# Patient Record
Sex: Male | Born: 1960 | Race: White | Hispanic: No | Marital: Single
Health system: Southern US, Community
[De-identification: ages and names within clinical notes are randomized; demographics above are authoritative.]

---

## 2005-04-04 ENCOUNTER — Ambulatory Visit: Payer: Self-pay | Admitting: Internal Medicine

## 2005-04-04 ENCOUNTER — Other Ambulatory Visit: Payer: Self-pay

## 2005-04-04 ENCOUNTER — Inpatient Hospital Stay: Payer: Self-pay

## 2012-03-14 ENCOUNTER — Inpatient Hospital Stay: Payer: Self-pay | Admitting: Family Medicine

## 2012-03-14 LAB — COMPREHENSIVE METABOLIC PANEL
Alkaline Phosphatase: 870 U/L — ABNORMAL HIGH (ref 50–136)
Calcium, Total: 9.4 mg/dL (ref 8.5–10.1)
Co2: 27 mmol/L (ref 21–32)
EGFR (African American): >60
EGFR (Non-African Amer.): >60
Osmolality: 276 (ref 275–301)
SGOT(AST): 372 U/L — ABNORMAL HIGH (ref 15–37)
SGPT (ALT): 438 U/L — ABNORMAL HIGH (ref 12–78)
Sodium: 137 mmol/L (ref 136–145)

## 2012-03-14 LAB — ETHANOL
Ethanol %: <0.003 % (ref 0.000–0.080)
Ethanol: <3 mg/dL

## 2012-03-14 LAB — URINALYSIS, COMPLETE
Bacteria: NONE SEEN
Blood: NEGATIVE
Glucose,UR: NEGATIVE mg/dL (ref 0–75)
Leukocyte Esterase: NEGATIVE
Nitrite: NEGATIVE
WBC UR: 1 /HPF (ref 0–5)

## 2012-03-14 LAB — MAGNESIUM: Magnesium: 2.2 mg/dL

## 2012-03-14 LAB — PROTIME-INR: Prothrombin Time: 12 secs (ref 11.5–14.7)

## 2012-03-14 LAB — CBC
HCT: 45.2 % (ref 40.0–52.0)
MCH: 33.1 pg (ref 26.0–34.0)
MCHC: 35 g/dL (ref 32.0–36.0)
MCV: 95 fL (ref 80–100)
Platelet: 249 10*3/uL (ref 150–440)
RDW: 14.1 % (ref 11.5–14.5)

## 2012-03-14 LAB — LIPASE, BLOOD: Lipase: 1102 U/L — ABNORMAL HIGH (ref 73–393)

## 2012-03-15 LAB — COMPREHENSIVE METABOLIC PANEL
Alkaline Phosphatase: 736 U/L — ABNORMAL HIGH (ref 50–136)
Bilirubin,Total: 9.7 mg/dL — ABNORMAL HIGH (ref 0.2–1.0)
Calcium, Total: 8.5 mg/dL (ref 8.5–10.1)
Chloride: 107 mmol/L (ref 98–107)
Co2: 24 mmol/L (ref 21–32)
Creatinine: 0.56 mg/dL — ABNORMAL LOW (ref 0.60–1.30)
EGFR (African American): >60
EGFR (Non-African Amer.): >60
SGOT(AST): 290 U/L — ABNORMAL HIGH (ref 15–37)
SGPT (ALT): 366 U/L — ABNORMAL HIGH (ref 12–78)
Sodium: 140 mmol/L (ref 136–145)

## 2012-03-15 LAB — CBC WITH DIFFERENTIAL/PLATELET
Basophil: 2 %
Eosinophil: 9 %
HCT: 41.3 % (ref 40.0–52.0)
HGB: 14.1 g/dL (ref 13.0–18.0)
Lymphocytes: 26 %
Monocytes: 14 %
RBC: 4.37 10*6/uL — ABNORMAL LOW (ref 4.40–5.90)
Segmented Neutrophils: 48 %
Variant Lymphocyte - H1-Rlymph: 1 %
WBC: 7.1 10*3/uL (ref 3.8–10.6)

## 2012-03-15 LAB — IRON AND TIBC
Iron Bind.Cap.(Total): 289 ug/dL (ref 250–450)
Iron: 78 ug/dL (ref 65–175)

## 2012-03-16 LAB — COMPREHENSIVE METABOLIC PANEL
Albumin: 2.6 g/dL — ABNORMAL LOW (ref 3.4–5.0)
Alkaline Phosphatase: 706 U/L — ABNORMAL HIGH (ref 50–136)
BUN: 7 mg/dL (ref 7–18)
Bilirubin,Total: 8.3 mg/dL — ABNORMAL HIGH (ref 0.2–1.0)
Chloride: 108 mmol/L — ABNORMAL HIGH (ref 98–107)
Creatinine: 0.62 mg/dL (ref 0.60–1.30)
EGFR (Non-African Amer.): >60
Osmolality: 281 (ref 275–301)
Potassium: 3.8 mmol/L (ref 3.5–5.1)
Sodium: 142 mmol/L (ref 136–145)

## 2012-03-16 LAB — HEPATIC FUNCTION PANEL A (ARMC): Bilirubin, Direct: 6 mg/dL — ABNORMAL HIGH (ref 0.00–0.20)

## 2012-03-16 LAB — TSH: Thyroid Stimulating Horm: 1.79 u[IU]/mL

## 2012-03-17 LAB — CBC WITH DIFFERENTIAL/PLATELET
Basophil: 1 %
Eosinophil: 3 %
HCT: 41.1 % (ref 40.0–52.0)
HGB: 13.4 g/dL (ref 13.0–18.0)
Lymphocytes: 22 %
MCHC: 32.7 g/dL (ref 32.0–36.0)
MCV: 95 fL (ref 80–100)
RDW: 13.4 % (ref 11.5–14.5)
WBC: 8 10*3/uL (ref 3.8–10.6)

## 2012-03-17 LAB — COMPREHENSIVE METABOLIC PANEL
Albumin: 2.7 g/dL — ABNORMAL LOW (ref 3.4–5.0)
Alkaline Phosphatase: 728 U/L — ABNORMAL HIGH (ref 50–136)
Bilirubin,Total: 5.8 mg/dL — ABNORMAL HIGH (ref 0.2–1.0)
Calcium, Total: 8.4 mg/dL — ABNORMAL LOW (ref 8.5–10.1)
Chloride: 108 mmol/L — ABNORMAL HIGH (ref 98–107)
Co2: 24 mmol/L (ref 21–32)
Creatinine: 0.71 mg/dL (ref 0.60–1.30)
EGFR (Non-African Amer.): >60
Glucose: 114 mg/dL — ABNORMAL HIGH (ref 65–99)
SGPT (ALT): 286 U/L — ABNORMAL HIGH (ref 12–78)
Total Protein: 6.2 g/dL — ABNORMAL LOW (ref 6.4–8.2)

## 2014-01-20 IMAGING — US ABDOMEN ULTRASOUND LIMITED
1 series · 14 of 25 positions shown · non-contrast
Comparison: none

REASON FOR EXAM: abnormal liver tests
COMMENTS:   Body Site: GB and Fossa, CBD, Head of Pancreas; Pancreas

PROCEDURE:     US  - US ABDOMEN LIMITED SURVEY  - March 14, 2012  [DATE]
RESULT:     Comparison: None
TECHNIQUE: Multiple gray-scale and color-flow Doppler images of the right
upper quadrant are presented for review.

[Series 1: abdomen ultrasound limited · 0.31mm/px · 14 of 43 slices shown]
[im 1/43]
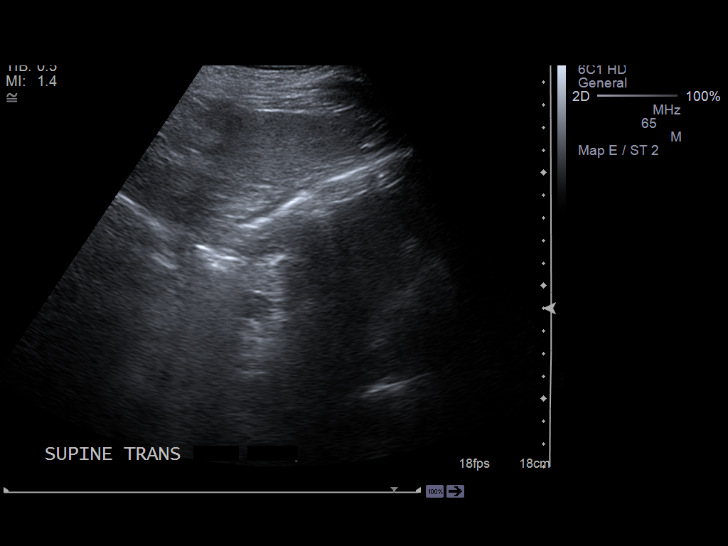
[im 4/43]
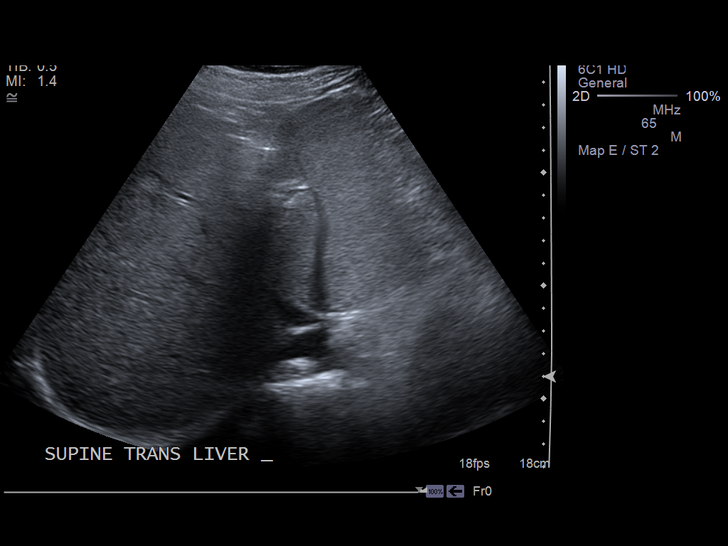
[im 8/43]
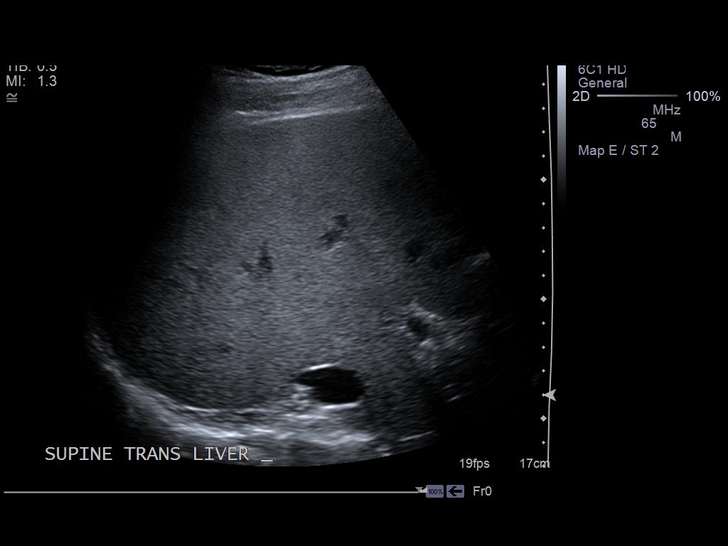
[im 11/43]
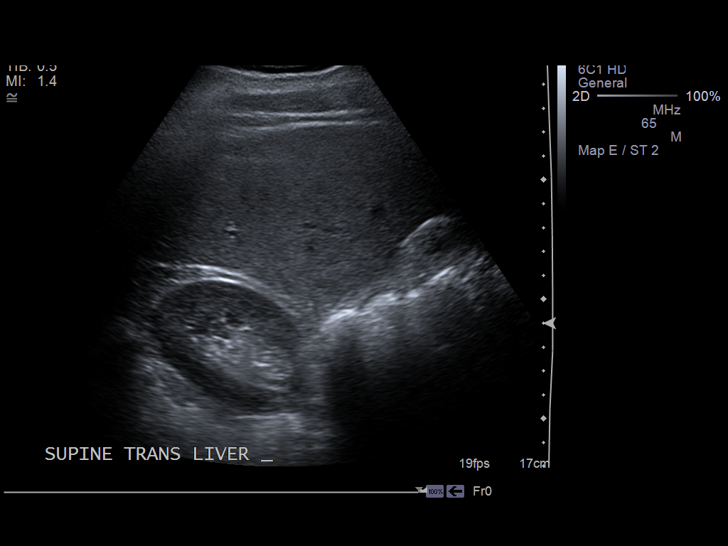
[im 15/43]
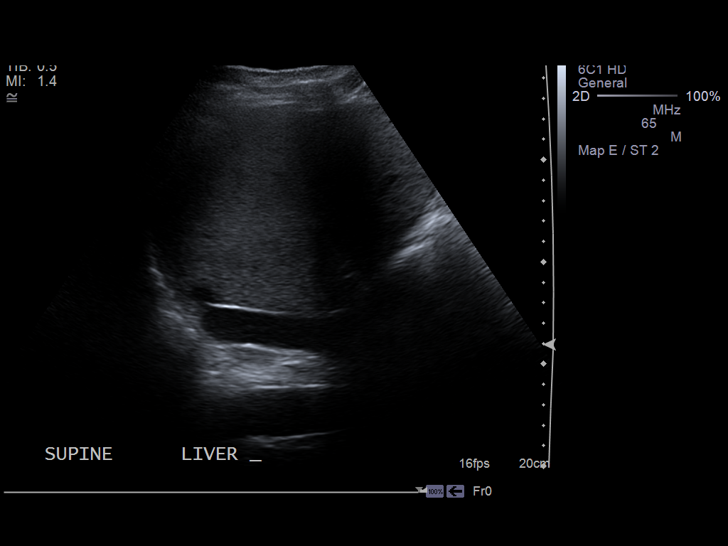
[im 16/43]
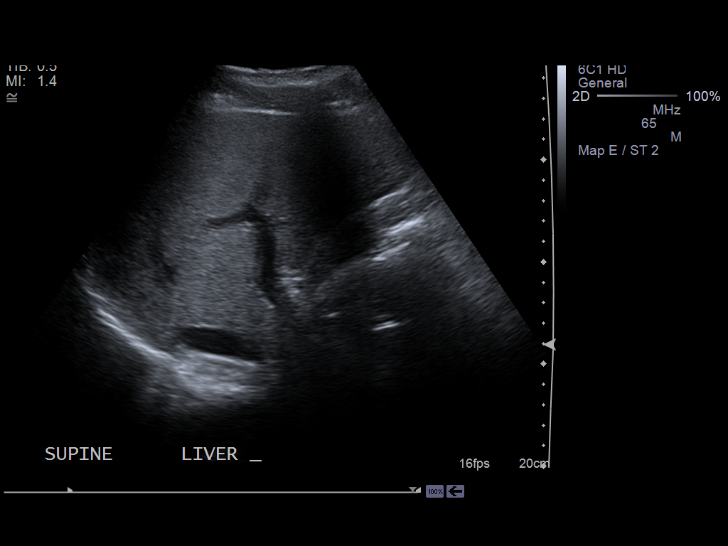
[im 20/43]
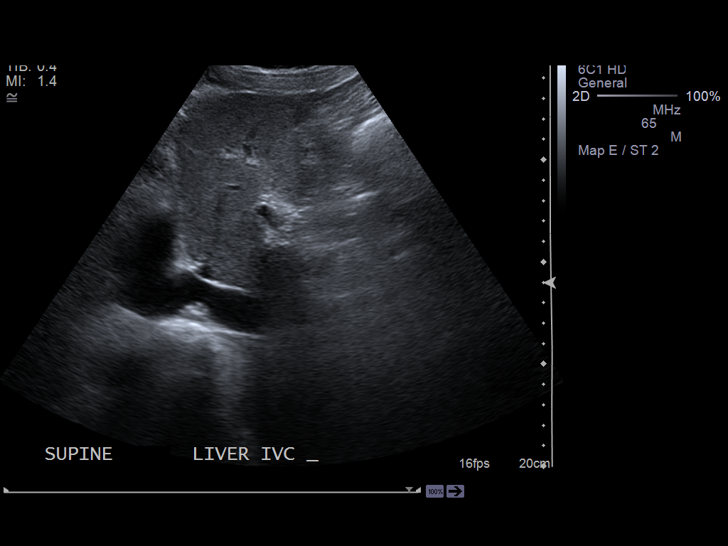
[im 23/43]
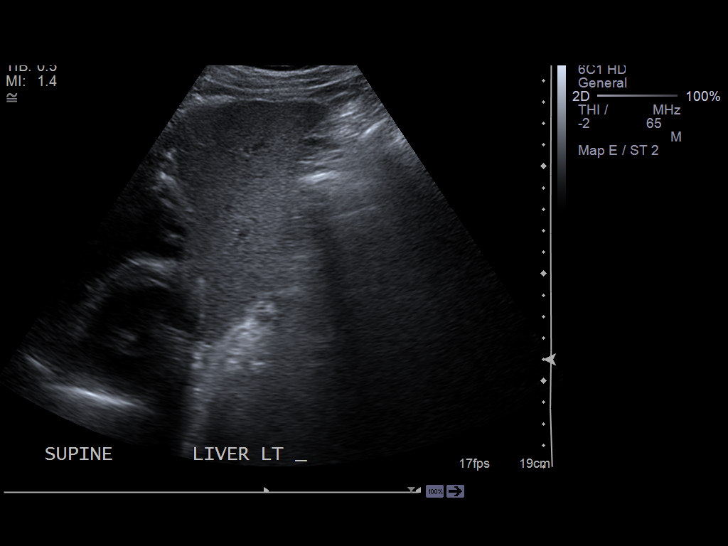
[im 27/43]
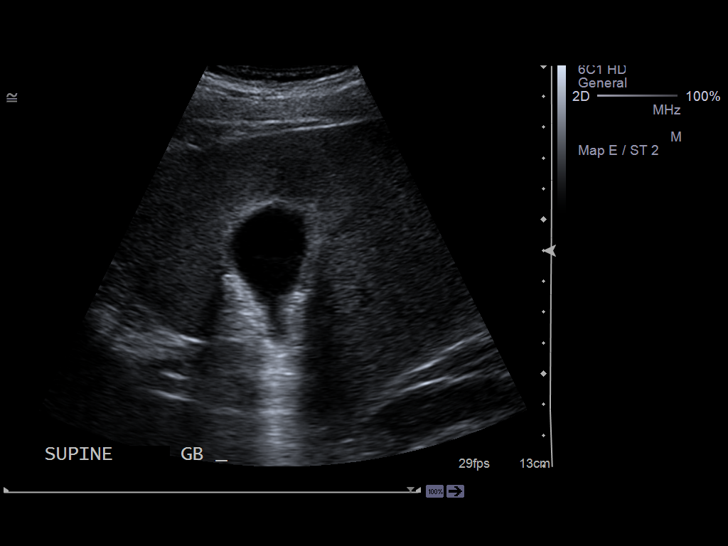
[im 29/43]
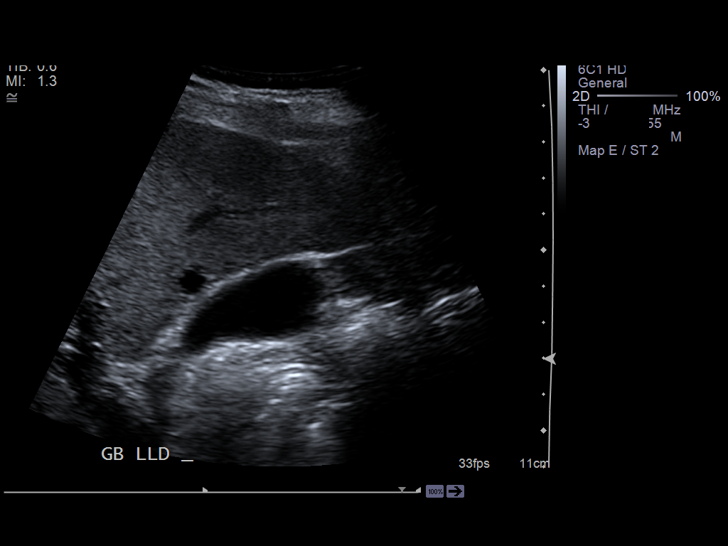
[im 32/43]
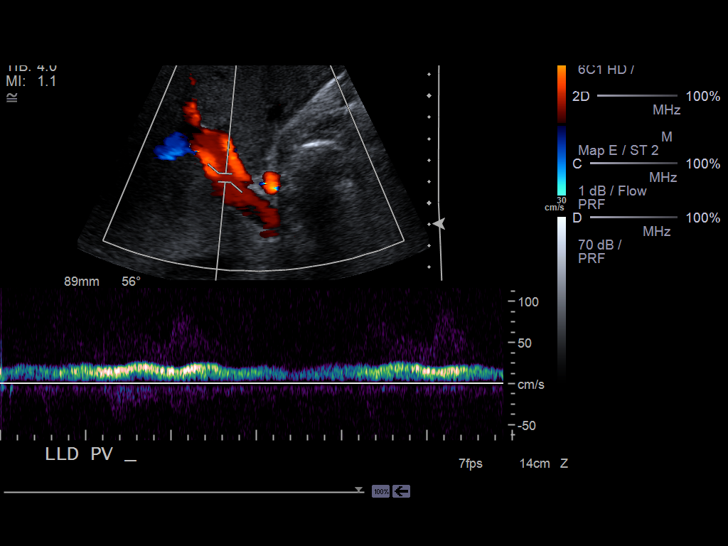
[im 36/43]
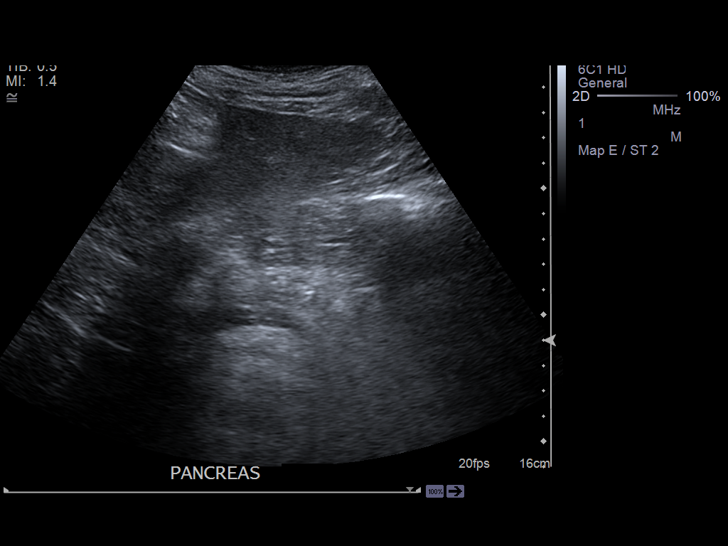
[im 39/43]
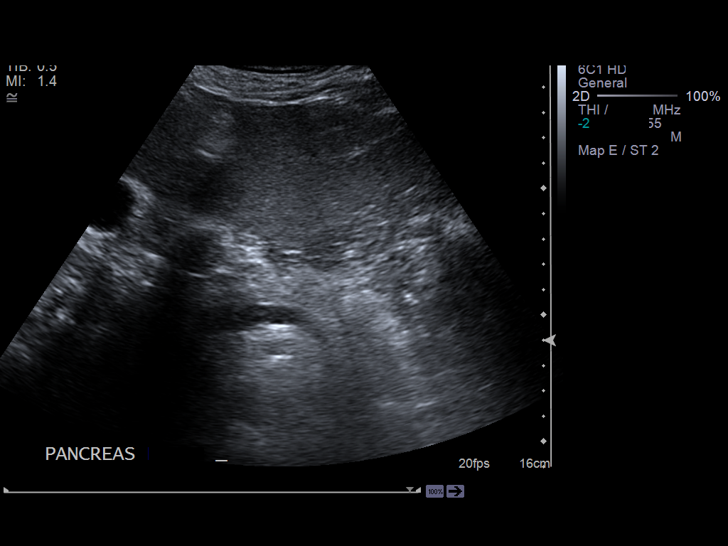
[im 43/43]
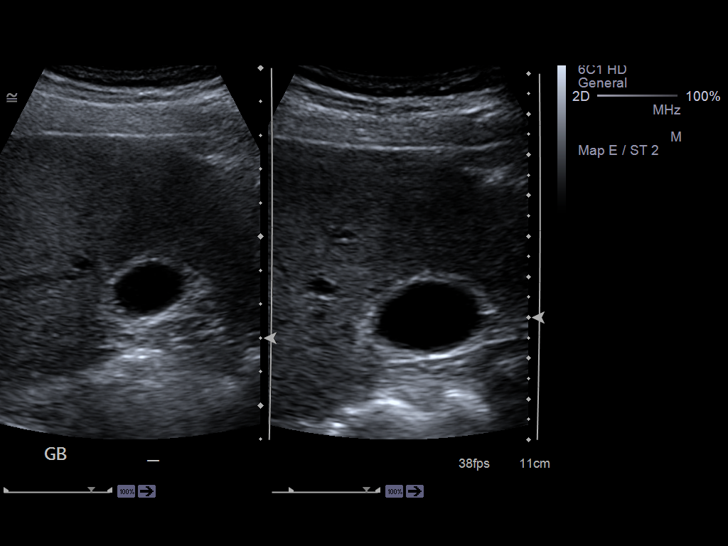

[14 of 25 positions shown; findings below may reference images not displayed]

FINDINGS: The liver is diffusely increased in echogenicity as can be seen with hepatic
steatosis. The liver is mildly increased in size measuring 18.6 cm in
length. The liver is without evidence of a focal hepatic lesion.

There is no cholelithiasis or biliary sludge. There is no intra- or
extrahepatic biliary ductal dilatation. The common duct measures 4.2 mm in
maximal diameter. There is no gallbladder wall thickening, pericholecystic
fluid, or sonographic Murphy's sign.

The pancreas is nonvisualized secondary to overlying bowel gas.
IMPRESSION: No cholelithiasis or sonographic evidence of acute cholecystitis.

Hepatic steatosis. Mild hepatomegaly.

[REDACTED]

## 2014-01-20 IMAGING — CT CT ABD-PELV W/ CM
1 of 2 series · 14 of 32 positions shown, 18 images · non-contrast
Comparison: none

REASON FOR EXAM: (1) jaudice; (2) jaundice
COMMENTS:

[Series 2: 3mm soft tissue · axial · 0.75mm/px · z∈[-520,-112]mm · 14 of 150 slices shown, 18 images]
[im 7/150  soft-tissue]
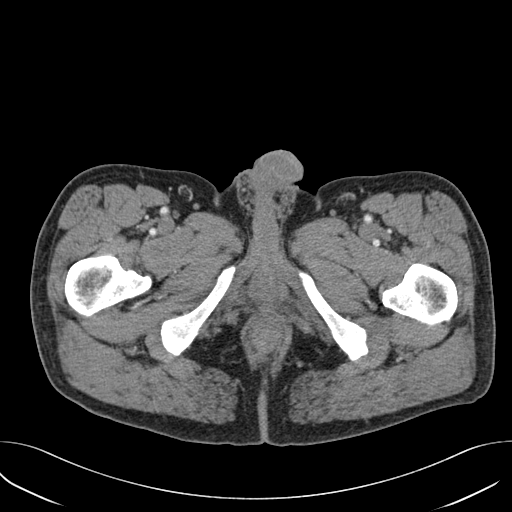
[im 7/150  bone]
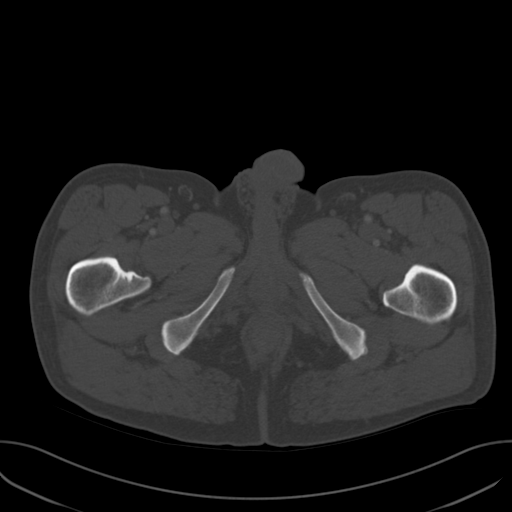
[im 19/150  soft-tissue]
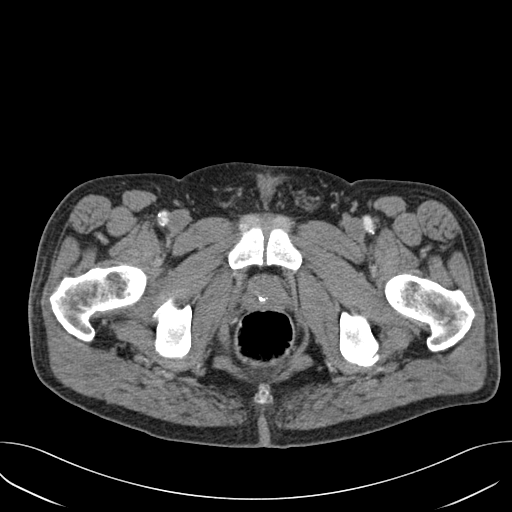
[im 32/150  soft-tissue]
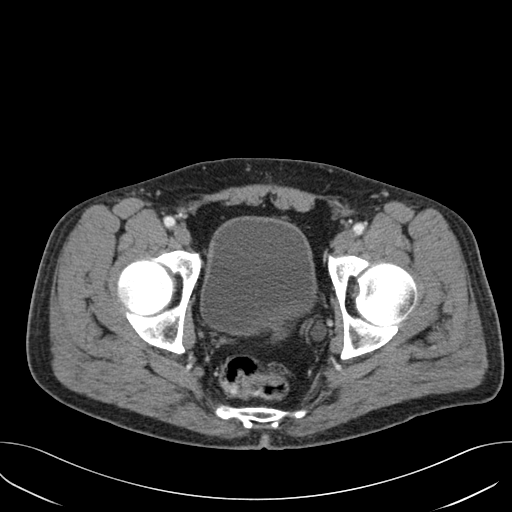
[im 44/150  soft-tissue]
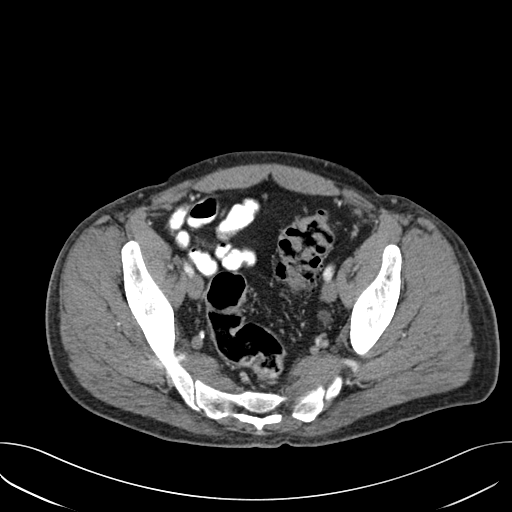
[im 56/150  soft-tissue]
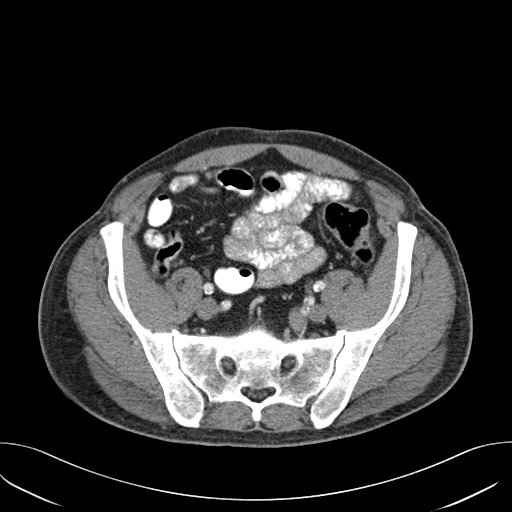
[im 69/150  soft-tissue]
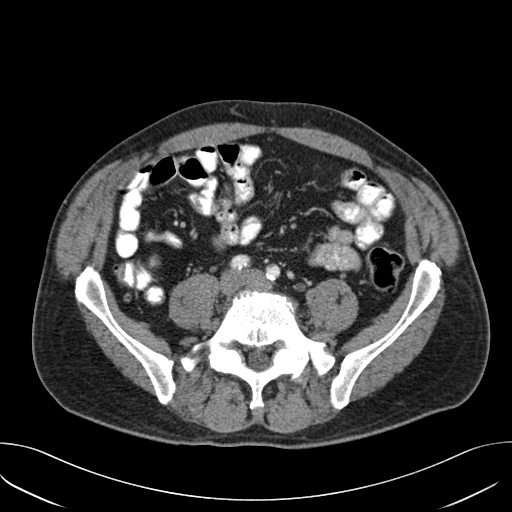
[im 81/150  soft-tissue]
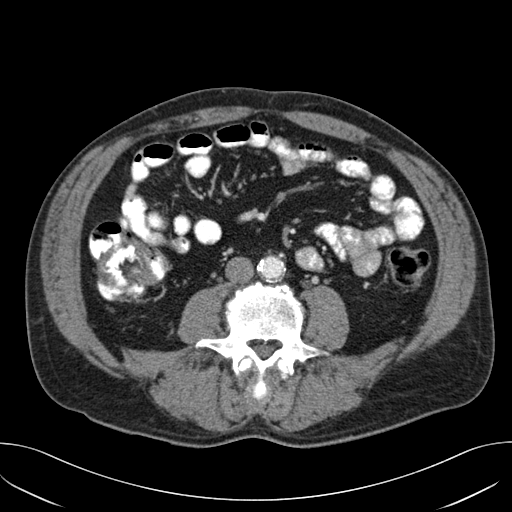
[im 94/150  soft-tissue]
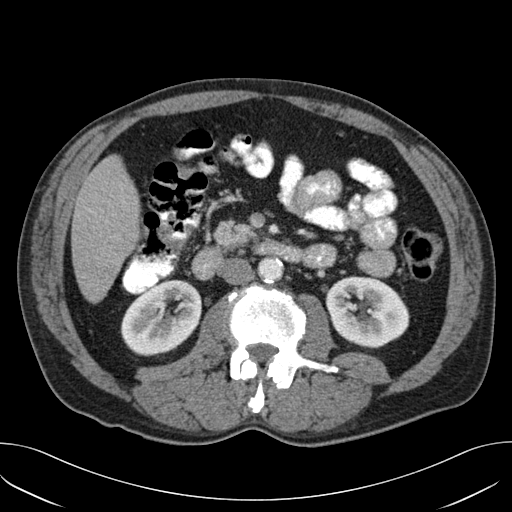
[im 106/150  soft-tissue]
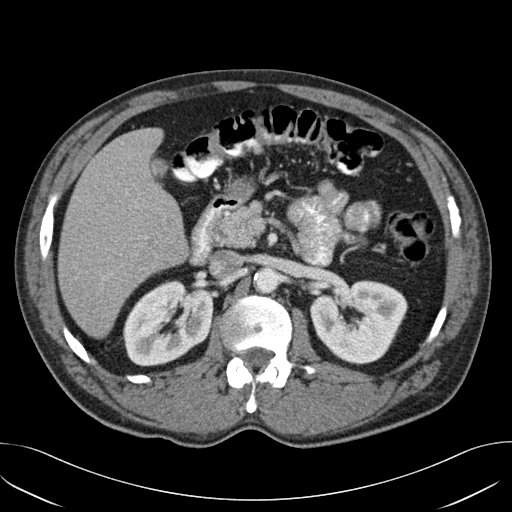
[im 106/150  bone]
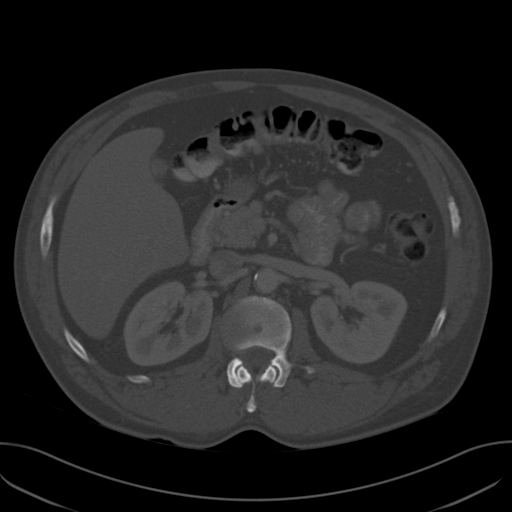
[im 118/150  soft-tissue]
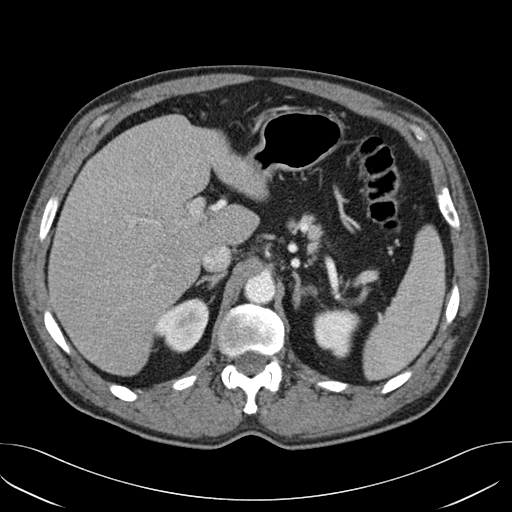
[im 125/150  lung]
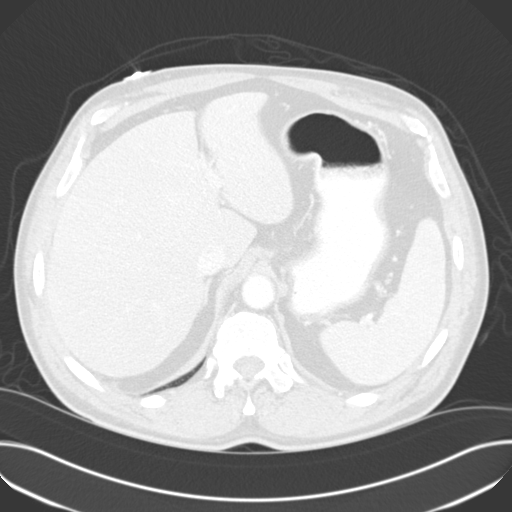
[im 131/150  soft-tissue]
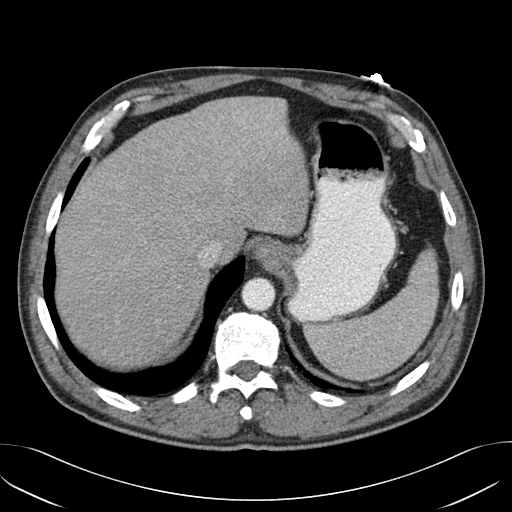
[im 131/150  lung]
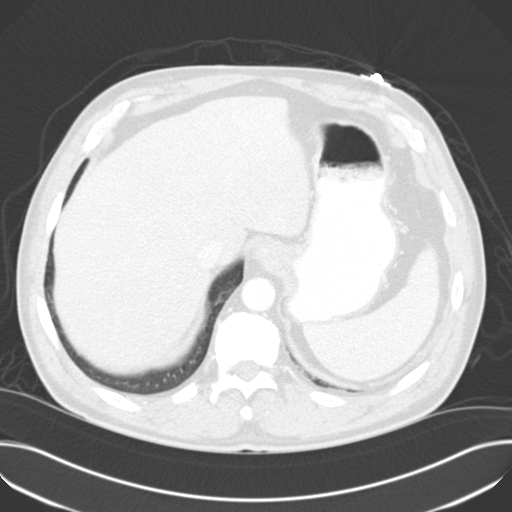
[im 137/150  lung]
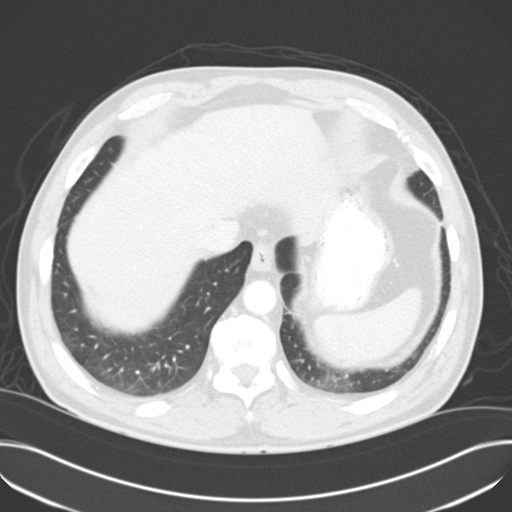
[im 143/150  soft-tissue]
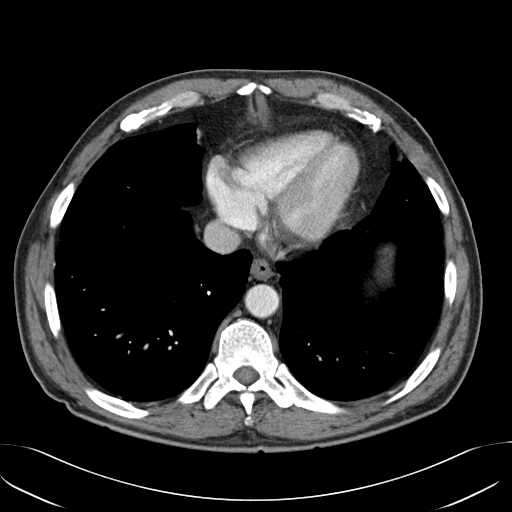
[im 143/150  lung]
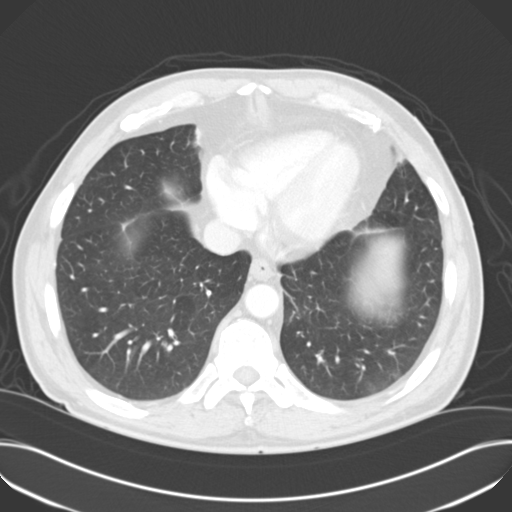

[14 of 32 positions shown; findings below may reference images not displayed]

PROCEDURE:     CT  - CT ABDOMEN / PELVIS  W  - March 15, 2012 [DATE]

RESULT:     Axial CT scanning was performed through the abdomen and pelvis
with reconstructions at 3 mm intervals and slice thicknesses following
intravenous administration of 100 cc of [id number]. Review of multiplanar
reconstructed images was performed separately on the VIA monitor.

The liver exhibits no focal mass nor ductal dilation. The gallbladder is
contracted. No stones are evident. The stomach is moderately distended with
orally administered contrast but no fluid is evident. The pancreas, spleen,
and adrenal glands are normal in appearance.

The kidneys enhance symmetrically. There are no calcified stones nor
evidence of obstruction of the intrarenal collecting systems. There is mild
dilation of the left ureter especially in its mid and distal thirds. There
may be a ureterocele at the level of the left ureterovesical junction. The
prostate gland produces a mild impression upon the urinary bladder base. No
bladder stone or urethral stone is visible.

The caliber of the abdominal aorta exhibits mild failure to taper in the
infrarenal region with the maximal measured diameter of 2.2 cm. The
partially contrast-filled loops of small bowel are normal in appearance. No
contrast has yet reached the colon. There is no evidence of colitis or
diverticulitis. A normal calibered partially air and contrast-filled
appendix is demonstrated.

The lung bases are clear. The lumbar vertebral bodies are preserved in
height. There is degenerative disc change of the mid and lower lumbar spine.
IMPRESSION: 1. There is mild dilation of the mid and distal portions of the left ureter
there may be a ureterocele. There is no evidence of hydronephrosis. There
are no findings to suggest pyelonephritis. No calcified stones are evident.
2. The gallbladder is contracted. This may be due to recent ingestion of a
meal but no food particles are demonstrated in the stomach. Dose calcified
gallstones are evident. Further evaluation with gallbladder ultrasound and a
hepatobiliary scan may be useful if gallbladder dysfunction is suspected.
3. There is no acute abnormality of the liver or pancreas or spleen.
4. No acute bowel abnormality is demonstrated.
5. There are mild atherosclerotic changes of the abdominal aorta.

A preliminary report was sent to the [HOSPITAL] the conclusion
of the study.

[REDACTED]

## 2014-08-26 NOTE — Consult Note (Signed)
CC: alcoholic liver disease.  Pt feeling better, ate all of tray and wants regular food.  LFT's falling some with TB down to 8.3, alk phos to 700, lipase normal.  No signs or symptoms of alcohol withdrawal.  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  Chest clear, heart RRR.  Will advance diet to low fat.  Dr, Dionne Milo will cover this weekend and will see the patient.  Electronic Signatures: Manya Silvas (MD)  (Signed on 08-Nov-13 12:59)  Authored  Last Updated: 08-Nov-13 12:59 by Manya Silvas (MD)

## 2014-08-26 NOTE — Discharge Summary (Signed)
PATIENT NAME:  Andrew Powers, Andrew Powers MR#:  [id number] DATE OF BIRTH:  1960/06/04  DATE OF ADMISSION:  03/14/2012 DATE OF DISCHARGE:  03/17/2012  ADDENDUM  TIME SPENT WITH THIS PATIENT ON DISCHARGE: 38 minutes education given about alcohol cessation and resources, alcoholic's anonymous. Case discussed with Child psychotherapistsocial worker, who will help also reinforcing this.   ____________________________ Felipa Furnaceoberto Sanchez Gutierrez, MD rsg:cms D: 03/17/2012 14:51:22 ET T: 03/19/2012 10:49:28 ET JOB#: [id number]  cc: Felipa Furnaceoberto Sanchez Gutierrez, MD, <Dictator> Jamesa Tedrick Juanda ChanceSANCHEZ GUTIERRE MD ELECTRONICALLY SIGNED 03/21/2012 12:34

## 2014-08-26 NOTE — H&P (Signed)
PATIENT NAME:  Andrew Powers, Verl MR#:  [id number] DATE OF BIRTH:  March 26, 1961  DATE OF ADMISSION:  03/14/2012  PRIMARY CARE PHYSICIAN: Toy CookeyErnest Eason, MD  ED REFERRING PHYSICIAN: Malachy Moanevainder Goli, MD  CHIEF COMPLAINT: Jaundice.   HISTORY OF PRESENT ILLNESS: The patient is a 54 year old white male with daily alcohol use who reported that he started noticing his eyes started becoming yellow in color. He also, when he was urinating, noticed that he had dark-colored urine. He did not have any abdominal pain, nausea, vomiting, or itching. He does report some blurred vision. He otherwise denies any fevers or chills, no chest pain, no shortness of breath, no changes in his bowel habits, no hematemesis, and no hematochezia.   PAST MEDICAL HISTORY: The patient denies any history, however, in reviewing previously noted history and physical, the patient had a cerebral acute infarct, as well as has history of hyperlipidemia. The patient denies any of this history.   PAST SURGICAL HISTORY: None.   ALLERGIES: No known drug allergies.   MEDICATIONS: None.   SOCIAL HISTORY: He smokes about one pack per day. He drinks five to six beers per day. He denies any hard liquor. He denies any drug use.   FAMILY HISTORY: Father with coronary artery disease.   REVIEW OF SYSTEMS: CONSTITUTIONAL: Denies any fevers, fatigue, weakness, or pain. No weight loss or weight gain. EYES: Does have some blurred vision. Denies any pain, redness, inflammation, glaucoma, or cataracts. ENT: Denies any tinnitus, ear pain, hearing loss, or difficulty swallowing. RESPIRATORY: Denies any cough, wheezing, or hemoptysis. CARDIOVASCULAR: Denies any chest pain. No orthopnea. No edema. No arrhythmia. GASTROINTESTINAL: No nausea, vomiting, diarrhea, abdominal pain, hematemesis, or melena. No gastroesophageal reflux disease. No irritable bowel syndrome. No rectal bleeding. GENITOURINARY: Denies any dysuria, hematuria, renal colic, or frequency. ENDOCRINE:  Denies any polyuria, nocturia, or thyroid problems. HEME/LYMPH: Denies anemia, easy bruisability, or bleeding. SKIN: No acne. He reports that his skin is jaundiced now. He denies any change in mole, hair, or skin. Denies any pain in the neck, back, or shoulder. NEURO: No numbness. No cerebrovascular accident. No transient ischemic attack. No seizures. PSYCHIATRIC: No anxiety. No insomnia. No ADD. No OCD.   PHYSICAL EXAMINATION:   VITAL SIGNS: Temperature 98.9, pulse 95, respirations 18, blood pressure 150/95, and O2 95% on room air.   GENERAL: The patient is a well-developed Caucasian male currently not in any acute distress.   HEENT: Head atraumatic, normocephalic. Pupils are equally round and reactive to light and accommodation. There is no conjunctival pallor. He has scleral icterus. Nasal exam shows no drainage or ulceration. Oropharynx is clear without any exudate.   NECK: No thyromegaly. No carotid bruits.   CARDIOVASCULAR: Regular rate and rhythm. No murmurs, rubs, clicks, or gallops. PMI is not displaced.   PULMONARY: Clear to auscultation bilaterally without any rales, rhonchi, or wheezing.   ABDOMEN: Soft and nondistended. Positive bowel sounds x4. There is no guarding. No rebound.   EXTREMITIES: No clubbing, cyanosis, or edema.   SKIN: He is jaundiced.   NEUROLOGICAL: Awake, alert, and oriented x3. No focal deficits.   LYMPHATICS: No lymph nodes palpable.   PSYCHIATRIC: Not anxious or depressed.   LABS/RADIOLOGIC STUDIES: Glucose 138, BUN 13, creatinine 0.62, sodium 137, potassium 3.6, chloride 104, and CO2 27. Magnesium 2.2. Lipase is 1102. Total protein is 7.1, albumin 3.1, bilirubin total 10.8, alkaline phosphatase 870, AST 372, and ALT 438. WBC 7.9, hemoglobin 15.8 and platelet count 249. INR 0.9.  Ultrasound per the ED MD  was negative.   ASSESSMENT AND PLAN: The patient is a 54 year old with history of alcohol abuse who presents with jaundice and elevated LFTs.   1. Painless jaundice with elevated LFTs. At this time, it seems to be obstructive jaundice, however, it also could be related to his alcohol abuse. At this time, we will go ahead and get a CT scan of the abdomen and pelvis with IV and p.o. contrast. We will get GI consult. Also hepatitis panel is currently pending.  2. Elevated lipase. No abdominal pain. We will keep him n.p.o. for the time being and repeat lipase in the a.m.  3. History of cerebrovascular accident, per old records; however, the patient does not recall and is not on any treatment.  4. Alcohol abuse. We will place him on CIWA protocol.  5. Nicotine addiction. The patient was counseled regarding smoking cessation. Nicotine patch offered. He would like to use a nicotine patch.   TIME SPENT: 45 minutes. ____________________________ Lacie Scotts Allena Katz, MD shp:slb D: 03/14/2012 22:28:23 ET T: 03/15/2012 08:22:15 ET JOB#: [id number]  cc: Russel Morain H. Allena Katz, MD, <Dictator> Serita Sheller. Maryellen Pile, MD Charise Carwin MD ELECTRONICALLY SIGNED 03/22/2012 13:26

## 2014-08-26 NOTE — Consult Note (Signed)
Chief Complaint:   Subjective/Chief Complaint Feels well. No complaints.   VITAL SIGNS/ANCILLARY NOTES: **Vital Signs.:   09-Nov-13 05:20   Vital Signs Type Routine   Temperature Temperature (F) 98.2   Celsius 36.7   Temperature Source Oral   Pulse Pulse 75   Respirations Respirations 18   Systolic BP Systolic BP 071   Diastolic BP (mmHg) Diastolic BP (mmHg) 90   Mean BP 106   Pulse Ox % Pulse Ox % 97   Pulse Ox Activity Level  At rest   Oxygen Delivery Room Air/ 21 %   Brief Assessment:   Additional Physical Exam Jaundiced. No distress. Awake and alert.   Lab Results: Hepatic:  09-Nov-13 03:02    Bilirubin, Total  5.8   Alkaline Phosphatase  728   SGPT (ALT)  286   SGOT (AST)  190   Total Protein, Serum  6.2   Albumin, Serum  2.7  Routine Chem:  09-Nov-13 03:02    Glucose, Serum  114   BUN 9   Creatinine (comp) 0.71   Sodium, Serum 141   Potassium, Serum 3.7   Chloride, Serum  108   CO2, Serum 24   Calcium (Total), Serum  8.4   Osmolality (calc) 281   eGFR (African American) >60   eGFR (Non-African American) >60 (eGFR values <29m/min/1.73 m2 may be an indication of chronic kidney disease (CKD). Calculated eGFR is useful in patients with stable renal function. The eGFR calculation will not be reliable in acutely ill patients when serum creatinine is changing rapidly. It is not useful in  patients on dialysis. The eGFR calculation may not be applicable to patients at the low and high extremes of body sizes, pregnant women, and vegetarians.)   Anion Gap 9  Routine Hem:  09-Nov-13 03:02    WBC (CBC) 8.0   RBC (CBC)  4.33   Hemoglobin (CBC) 13.4   Hematocrit (CBC) 41.1   Platelet Count (CBC) 289 (Result(s) reported on 17 Mar 2012 at 04:37AM.)   MCV 95   MCH 30.9   MCHC 32.7   RDW 13.4   Bands 1   Segmented Neutrophils 63   Lymphocytes 22   Monocytes 10   Eosinophil 3   Basophil 1   Diff Comment 1 ANISOCYTOSIS   Diff Comment 2 POIKILOCYTOSIS   Diff  Comment 3 LARGE PLATELETS   Diff Comment 4 PLTS VARIED IN SIZE  Result(s) reported on 17 Mar 2012 at 04:37AM.   Assessment/Plan:  Assessment/Plan:   Assessment Alcoholic hepatitis with jaundice. Bilirubin continues to improve. No signs of hepatic failure or encephalopathy. No signs of alcohol withdrawl.    Plan May go home if OK with primary service. Continue Thiamine as OP. Follow up with Dr. EVira Agaras OP in 2 weeks.   Electronic Signatures: IJill Side(MD)  (Signed [id number])  Authored: Chief Complaint, VITAL SIGNS/ANCILLARY NOTES, Brief Assessment, Lab Results, Assessment/Plan   Last Updated: 09-Nov-13 11:05 by IJill Side(MD)

## 2014-08-26 NOTE — Discharge Summary (Signed)
PATIENT NAME:  Andrew Powers, Keil MR#:  [id number] DATE OF BIRTH:  1960/11/20  DATE OF ADMISSION:  03/14/2012 DATE OF DISCHARGE:  03/17/2012  CONSULTANT: Lynnae Prudeobert Elliott, MD - Gastroenterology.   REASON FOR ADMISSION: Jaundice.  DISCHARGE DIAGNOSIS: Alcoholic hepatitis.   OTHER DIAGNOSES:  1. Painless jaundice with elevated liver enzymes.  2. Elevated lipase without abdominal pain.  3. History of cerebellar cerebrovascular accident, no sequels.  4. History of alcohol abuse.  5. Nicotine addiction.  6. Slightly elevated glucose due to liver disease.  7. Proteinuria.   HOSPITAL COURSE: Mr. Andrew Powers is a 54 year old gentleman who has history of alcohol abuse. He presented on 03/14/2012 to the ER because he started noticing that his eyes became yellow, he was also urinating very dark color Coca-Cola-like urine, and he got concerned. He did not have any abdominal pain. He did not have any bleeding, hematochezia, or melena. He was just concerned about it. The patient was admitted for investigation of the symptoms. His glucose was slightly elevated at 138, his creatinine was around 0.6 to 0.7, his electrolytes were within normal limits, his calcium was in between 8.4 and 9, his lipase was 1100, his ferritin level was 800, his total protein was around 6, and his albumin was 2.7. His total bilirubin was 10.8 at admission and 5.8 at discharge. His direct bilirubin was 6. His LFTs were elevated with AST and ALT at 372 and 438, respectively, and alkaline phosphatase at 870. His discharge AST is 190 and ALT is 286. His white count was normal at 8.0 and hemoglobin was 13.4. His platelets were 289. His INR was 0.9. His urinalysis was overall normal, except for bilirubin and protein more than 30 milligrams. He had a AFP tumor marker of 2.4 and negative hepatitis B surface antigen, negative hepatitis panel, and negative HIV antibody, apparently, as a preliminary report. EKG was within normal limits. The patient was also  taking for a CT scan of the abdomen that showed mild dilation of the mid and distal portions of the left ureter due to ureterocele, no evidence of hydronephrosis, and no findings suggesting pyelonephritis. Gallbladder was contracted. No gallstones. No acute abnormality of the liver or pancreas was seen. There was mild arteriosclerosis of the aorta.  Gastroenterology was consulted. Dr. Mechele CollinElliott confirmed the diagnosis of alcoholic liver disease and he needed to go to Alcoholic's Anonymous treatment The patient started improving and he did not require any steroids. His diet was advanced and the patient was discharged on 03/17/2012 with general recommendations. The patient needs to go to Alcoholic's Anonymous and he agreed and that information has been given. His jaundice has improved. He is going to be taking thiamine, folic acid, and multivitamins, all over-the-counter, and he is going to follow with the [facility]. The patient is an alcoholic. He is not going to be prescribed any other medications for withdrawal. The patient states that he is okay doing it without them and overall he did not have any withdrawal symptoms during this hospitalization. The patient is to follow-up [facility] in 1 to 2 weeks.  ____________________________ Andrew Furnaceoberto Sanchez Gutierrez, MD rsg:slb D: 03/17/2012 14:42:22 ET T: 03/19/2012 10:44:44 ET JOB#: [id number]  cc: Andrew Furnaceoberto Sanchez Gutierrez, MD, <Dictator> [facility] Victoria Henshaw Juanda ChanceSANCHEZ GUTIERRE MD ELECTRONICALLY SIGNED 03/21/2012 12:34

## 2014-08-26 NOTE — Consult Note (Signed)
Pt CC: alcohol liver disease.  Pt brother an alcoholic per the patient.  No prior EGD or colonoscopy, Jaundice with elevated LFT's and lipase elevation.  Discussed AA with him.  Will follow with you, routine tests to rule out other causes of acute hepatitis.  Electronic Signatures: Scot JunElliott, Robert T (MD)  (Signed on 07-Nov-13 19:25)  Authored  Last Updated: 07-Nov-13 19:25 by Scot JunElliott, Robert T (MD)

## 2014-08-26 NOTE — Consult Note (Signed)
PATIENT NAME:  Andrew Powers, Andrew Powers MR#:  [id number] DATE OF BIRTH:  Nov 04, 1960  DATE OF CONSULTATION:  03/15/2012  REFERRING PHYSICIAN:  Dr. Auburn Bilberry  CONSULTING PHYSICIAN:  Ranae Plumber. Arvilla Market, ANP / Dr. Lynnae Prude   REASON FOR CONSULTATION: Jaundice.   HISTORY OF PRESENT ILLNESS: This 54 year old patient with daily alcohol use was admitted to the hospital yesterday for acute episode of jaundice. Patient states he has never noticed yellowing of the eyes or skin in the past until last week. He said he felt fine last Sunday, but Monday evening he had a sick feeling in the center of his epigastric area. Says it really was not nausea, it was more of a mild vague discomfort. He did not feel like eating as much but there was no vomiting. The following day he noticed his eyes were yellow, and he had noticed tea-colored urine as well. Patient presented to the Emergency Room and for evaluation and was found to have marked elevated liver enzymes, and GI is asked to see patient regarding further evaluation and management.   Patient states he has never been told he had liver disease. He does admit to drinking. He does report drinking eight beers a day for about 20 years. States he has never been told to discontinue alcohol. He does report two DUIs in the past. He denies any hepatitis risk factors. He denies any recent flulike illnesses. He has had marked fatigue started last week, to the point where he would come home from work and have to be on the couch. This is totally unlike his normal activity level so this has been an abrupt change in his overall health in the last week. He does recall using antibiotics for sinusitis last month. He denies any significant abdominal pain at this time.   PAST MEDICAL HISTORY:  1. He had a cerebrovascular accident 2006, thinks he was treated with low-dose aspirin, fish oil, maybe another anticoagulant, he does not recall.  2. Hyperlipidemia.  3. Daily alcohol use.   4. Tobacco use.   PAST SURGICAL HISTORY: Denied.   MEDICATIONS: None.   ALLERGIES: NKDA.   HABITS: Positive one pack per day for over a couple of decades. Positive 8 beers daily for 20 year history. Denies hard liquor. He denies any history of IV drug use, tattoos, blood transfusion or multiple sexual partners.   FAMILY HISTORY: Father with heart disease. Negative for GI cancer.   SOCIAL HISTORY: Patient is divorced. No children. Works at [employer]. Patient reports two DUIs in the distant past.   REVIEW OF SYSTEMS: 10 systems reviewed. Positive for weakness and fatigue started last week. GI history otherwise as noted in present illness, otherwise negative.   PHYSICAL EXAMINATION:  VITAL SIGNS: Temperature 98.3, pulse 83, respirations 14, blood pressure 139/84, 94% room air.   GENERAL: Well-appearing Caucasian male sitting in bed visiting with family and friends, in no acute distress. Patient is joking and laughing.   HEENT: Head is normocephalic. Conjunctivae pink. Sclera is very obviously icterus. Oral mucosa is dry and intact. No bleeding from the gums.   NECK: Supple. Trachea midline. No thyromegaly.   HEART: Heart tones S1, S2 without murmur or gallop.   LUNGS: Clear to auscultation. Respirations are eupneic.   ABDOMEN: Soft, nondistended, nontender. Bowel sounds are present. Slight liver edge palpable on the right. No masses.   RECTAL: Deferred.   EXTREMITIES: Without edema, cyanosis, or clubbing.   SKIN: Obviously jaundiced. He also has red tape rash  across the upper chest, this may be new, or maybe sunburn he is not certain. No obvious bruising noted.   MUSCULOSKELETAL: No joint swelling, inflammation noted.   NEUROLOGIC: He is alert, oriented. Speech is clear, interacting appropriately. No tremors.   PSYCH: Affect and mood within normal, cooperative, pleasant.   LABORATORY, DIAGNOSTIC AND RADIOLOGICAL DATA: Admission blood work with glucose 138,  BUN 13, creatinine 0.62. Electrolytes unremarkable. Lipase 1102, albumin 3.1, total bilirubin 10.8, alkaline phosphatase 870, AST 372, ALT 438, WBC 7.9, hemoglobin 14.8. Pro time 12.0, INR 0.9, PTT 28.6. Urinalysis 2+ bilirubin.   Repeat laboratory studies 03/15/2012: Glucose 123, BUN 9, creatinine 0.56, sodium 140, potassium 3.5, calcium 8.5, albumin 2.6, total bilirubin 9.7, alkaline phosphatase 736, AST 290, ALT 366, WBC 7.1, hemoglobin 14.1, hematocrit down 41.3, platelets 248, ferritin elevated 800, total iron 289, iron saturation 27.   Abdominal ultrasound performed 03/14/2012 shows pancreas nonvisualized. No cholelithiasis or evidence of acute cholecystitis. Hepatic steatosis, mild hepatomegaly. Liver size is 18.6. No mention of ascites, no ductal dilatation.   CT of the abdomen and pelvis performed 03/15/2012 shows liver without focal mass or ductal dilatation, gallbladder contracted, no stones evident. Pancreas, spleen, adrenal glands are normal in appearance. Mild dilatation of the left ureter, maybe ureterocele at the level of the left ureterovesical junction. There is failure of the abdominal aorta to taper with maximum diameter 2.2 cm. No contrast reached the colon. No evidence of colitis or diverticulitis. Appendix appears normal. Lung bases are clear. There was mild atherosclerotic changes of the abdominal aorta. Gallbladder contracted. Calcified gallstones are evident. Chest x-ray performed showed negative abnormality.   IMPRESSION: Patient with daily alcohol use, hepatomegaly, elevated liver enzymes. This could represent acute hepatitis. There is no evidence of bile duct dilatation or masses. Abdomen is nontender without ascites, liver edge palpable consistent with hepatomegaly. His coags, platelet count normal, ferritin elevated noted.   PLAN: Avoid alcohol and we discussed alcohol discontinuing altogether. I also told patient that with his history of 20 year daily use, DUIs, he would  benefit from alcohol rehab. Patient listened without comment. We will add labs to check AFP, hepatitis A, B, and C, HIV. HIDA study would not be useful in the setting of such profound elevation in bilirubin and liver enzymes as the liver is probably not going to process well. Recommend Protonix twice daily. Continue watchful waiting and anticipate improvement in liver panel. Would also had repeat lipase as he could have a small component of pancreatitis. The CT, however, did not show any abnormality in pancreas and that is encouraging.   This case was discussed with Dr. Mechele CollinElliott in collaboration of care.   These services provided by Cala BradfordKimberly A. Arvilla MarketMills, MS, APRN, BC, ANP under collaborative agreement with Dr. Lynnae Prudeobert Elliott.   ____________________________ Ranae PlumberKimberly A. Arvilla MarketMills, ANP kam:cms D: 03/15/2012 18:15:36 ET T: 03/16/2012 09:50:24 ET JOB#: [id number]  cc: Cala BradfordKimberly A. Arvilla MarketMills, ANP, <Dictator>  Ranae PlumberKimberly A. Suzette BattiestMills RN, MSN, ANP-BC Adult Nurse Practitioner ELECTRONICALLY SIGNED 03/19/2012 17:28

## 2014-08-26 NOTE — Consult Note (Signed)
Brief Consult Note: Diagnosis: Alcoholic liver disease, probable alcoholic hepatitis, alcoholic pancreatitis.   Patient was seen by consultant.   Consult note dictated.   Orders entered.   Comments: Ct and US showed no dilated ducts or masses. Abdomen is non-tender without ascites. Liver edge palpable. Coags, plt normal, ferritin elevated. PLAN: Avoid ETOH-Ciwa in place. Pt is comfortable. Check AFP, Hep abc, HIV in am. HIDA will not be useful since liver functioning is poor now as demonstrated by elev bili and lfts. Gallstones-calcified per US. Protnix bid. Watchful waiting. I told pt he had alcohol abuse and needed to stop drinking altogether and offered outpt alcohol counseling if needed. He listened and did not comment. This case d/w Dr. Mechele CollinElliott in collaboration of care.  Electronic Signatures: Rowan BlaseMills, Bryla Burek Ann (NP)  (Signed [phone]-Nov-13 16:50)  Authored: Brief Consult Note   Last Updated: 07-Nov-13 16:50 by Rowan BlaseMills, Kalin Amrhein Ann (NP)

## 2022-11-16 ENCOUNTER — Other Ambulatory Visit: Payer: Self-pay

## 2022-11-16 DIAGNOSIS — G8929 Other chronic pain: Secondary | ICD-10-CM

## 2022-11-16 DIAGNOSIS — M5442 Lumbago with sciatica, left side: Secondary | ICD-10-CM

## 2022-11-17 ENCOUNTER — Other Ambulatory Visit: Payer: Self-pay | Admitting: Physical Medicine & Rehabilitation

## 2022-11-17 DIAGNOSIS — M5442 Lumbago with sciatica, left side: Secondary | ICD-10-CM

## 2022-11-17 DIAGNOSIS — G8929 Other chronic pain: Secondary | ICD-10-CM

## 2022-11-22 ENCOUNTER — Ambulatory Visit
Admission: RE | Admit: 2022-11-22 | Discharge: 2022-11-22 | Disposition: A | Discharge status: Home / Self Care | Payer: No Typology Code available for payment source | Source: Ambulatory Visit | Attending: Physical Medicine & Rehabilitation | Admitting: Physical Medicine & Rehabilitation

## 2022-11-22 DIAGNOSIS — M5442 Lumbago with sciatica, left side: Secondary | ICD-10-CM

## 2022-11-22 DIAGNOSIS — G8929 Other chronic pain: Secondary | ICD-10-CM

## 2023-03-27 LAB — EXTERNAL GENERIC LAB PROCEDURE: COLOGUARD: NEGATIVE

## 2023-03-27 LAB — COLOGUARD: COLOGUARD: NEGATIVE

## 2023-12-19 DIAGNOSIS — M9901 Segmental and somatic dysfunction of cervical region: Secondary | ICD-10-CM | POA: Diagnosis not present

## 2023-12-19 DIAGNOSIS — M9902 Segmental and somatic dysfunction of thoracic region: Secondary | ICD-10-CM | POA: Diagnosis not present

## 2023-12-19 DIAGNOSIS — M9903 Segmental and somatic dysfunction of lumbar region: Secondary | ICD-10-CM | POA: Diagnosis not present

## 2023-12-19 DIAGNOSIS — M9907 Segmental and somatic dysfunction of upper extremity: Secondary | ICD-10-CM | POA: Diagnosis not present

## 2023-12-22 DIAGNOSIS — M9907 Segmental and somatic dysfunction of upper extremity: Secondary | ICD-10-CM | POA: Diagnosis not present

## 2023-12-22 DIAGNOSIS — M9902 Segmental and somatic dysfunction of thoracic region: Secondary | ICD-10-CM | POA: Diagnosis not present

## 2023-12-22 DIAGNOSIS — M9903 Segmental and somatic dysfunction of lumbar region: Secondary | ICD-10-CM | POA: Diagnosis not present

## 2023-12-22 DIAGNOSIS — M9901 Segmental and somatic dysfunction of cervical region: Secondary | ICD-10-CM | POA: Diagnosis not present

## 2023-12-26 DIAGNOSIS — M9901 Segmental and somatic dysfunction of cervical region: Secondary | ICD-10-CM | POA: Diagnosis not present

## 2023-12-26 DIAGNOSIS — M9903 Segmental and somatic dysfunction of lumbar region: Secondary | ICD-10-CM | POA: Diagnosis not present

## 2023-12-26 DIAGNOSIS — M9902 Segmental and somatic dysfunction of thoracic region: Secondary | ICD-10-CM | POA: Diagnosis not present

## 2023-12-26 DIAGNOSIS — M9907 Segmental and somatic dysfunction of upper extremity: Secondary | ICD-10-CM | POA: Diagnosis not present

## 2023-12-29 DIAGNOSIS — M9902 Segmental and somatic dysfunction of thoracic region: Secondary | ICD-10-CM | POA: Diagnosis not present

## 2023-12-29 DIAGNOSIS — M9903 Segmental and somatic dysfunction of lumbar region: Secondary | ICD-10-CM | POA: Diagnosis not present

## 2023-12-29 DIAGNOSIS — M9901 Segmental and somatic dysfunction of cervical region: Secondary | ICD-10-CM | POA: Diagnosis not present

## 2023-12-29 DIAGNOSIS — M9907 Segmental and somatic dysfunction of upper extremity: Secondary | ICD-10-CM | POA: Diagnosis not present

## 2024-01-02 DIAGNOSIS — M9902 Segmental and somatic dysfunction of thoracic region: Secondary | ICD-10-CM | POA: Diagnosis not present

## 2024-01-02 DIAGNOSIS — M9907 Segmental and somatic dysfunction of upper extremity: Secondary | ICD-10-CM | POA: Diagnosis not present

## 2024-01-02 DIAGNOSIS — M9901 Segmental and somatic dysfunction of cervical region: Secondary | ICD-10-CM | POA: Diagnosis not present

## 2024-01-02 DIAGNOSIS — M9903 Segmental and somatic dysfunction of lumbar region: Secondary | ICD-10-CM | POA: Diagnosis not present

## 2024-01-03 DIAGNOSIS — M9903 Segmental and somatic dysfunction of lumbar region: Secondary | ICD-10-CM | POA: Diagnosis not present

## 2024-01-03 DIAGNOSIS — M9907 Segmental and somatic dysfunction of upper extremity: Secondary | ICD-10-CM | POA: Diagnosis not present

## 2024-01-03 DIAGNOSIS — M9902 Segmental and somatic dysfunction of thoracic region: Secondary | ICD-10-CM | POA: Diagnosis not present

## 2024-01-03 DIAGNOSIS — M9901 Segmental and somatic dysfunction of cervical region: Secondary | ICD-10-CM | POA: Diagnosis not present

## 2024-01-10 DIAGNOSIS — M9903 Segmental and somatic dysfunction of lumbar region: Secondary | ICD-10-CM | POA: Diagnosis not present

## 2024-01-10 DIAGNOSIS — M9901 Segmental and somatic dysfunction of cervical region: Secondary | ICD-10-CM | POA: Diagnosis not present

## 2024-01-10 DIAGNOSIS — M9902 Segmental and somatic dysfunction of thoracic region: Secondary | ICD-10-CM | POA: Diagnosis not present

## 2024-01-10 DIAGNOSIS — M9907 Segmental and somatic dysfunction of upper extremity: Secondary | ICD-10-CM | POA: Diagnosis not present

## 2024-01-12 DIAGNOSIS — M9907 Segmental and somatic dysfunction of upper extremity: Secondary | ICD-10-CM | POA: Diagnosis not present

## 2024-01-12 DIAGNOSIS — M9901 Segmental and somatic dysfunction of cervical region: Secondary | ICD-10-CM | POA: Diagnosis not present

## 2024-01-12 DIAGNOSIS — M9902 Segmental and somatic dysfunction of thoracic region: Secondary | ICD-10-CM | POA: Diagnosis not present

## 2024-01-12 DIAGNOSIS — M9903 Segmental and somatic dysfunction of lumbar region: Secondary | ICD-10-CM | POA: Diagnosis not present

## 2024-01-16 DIAGNOSIS — M9903 Segmental and somatic dysfunction of lumbar region: Secondary | ICD-10-CM | POA: Diagnosis not present

## 2024-01-16 DIAGNOSIS — M9901 Segmental and somatic dysfunction of cervical region: Secondary | ICD-10-CM | POA: Diagnosis not present

## 2024-01-16 DIAGNOSIS — M9902 Segmental and somatic dysfunction of thoracic region: Secondary | ICD-10-CM | POA: Diagnosis not present

## 2024-01-16 DIAGNOSIS — M9907 Segmental and somatic dysfunction of upper extremity: Secondary | ICD-10-CM | POA: Diagnosis not present

## 2024-01-19 DIAGNOSIS — M9907 Segmental and somatic dysfunction of upper extremity: Secondary | ICD-10-CM | POA: Diagnosis not present

## 2024-01-19 DIAGNOSIS — M9903 Segmental and somatic dysfunction of lumbar region: Secondary | ICD-10-CM | POA: Diagnosis not present

## 2024-01-19 DIAGNOSIS — M9902 Segmental and somatic dysfunction of thoracic region: Secondary | ICD-10-CM | POA: Diagnosis not present

## 2024-01-19 DIAGNOSIS — M9901 Segmental and somatic dysfunction of cervical region: Secondary | ICD-10-CM | POA: Diagnosis not present

## 2024-03-05 DIAGNOSIS — F17210 Nicotine dependence, cigarettes, uncomplicated: Secondary | ICD-10-CM | POA: Diagnosis not present

## 2024-03-05 DIAGNOSIS — Z0000 Encounter for general adult medical examination without abnormal findings: Secondary | ICD-10-CM | POA: Diagnosis not present

## 2024-03-05 DIAGNOSIS — R7303 Prediabetes: Secondary | ICD-10-CM | POA: Diagnosis not present

## 2024-03-05 DIAGNOSIS — F1010 Alcohol abuse, uncomplicated: Secondary | ICD-10-CM | POA: Diagnosis not present

## 2024-03-05 DIAGNOSIS — Z872 Personal history of diseases of the skin and subcutaneous tissue: Secondary | ICD-10-CM | POA: Diagnosis not present

## 2024-03-05 DIAGNOSIS — Z125 Encounter for screening for malignant neoplasm of prostate: Secondary | ICD-10-CM | POA: Diagnosis not present

## 2024-03-05 DIAGNOSIS — Z122 Encounter for screening for malignant neoplasm of respiratory organs: Secondary | ICD-10-CM | POA: Diagnosis not present

## 2024-03-05 DIAGNOSIS — I10 Essential (primary) hypertension: Secondary | ICD-10-CM | POA: Diagnosis not present

## 2024-03-05 NOTE — Progress Notes (Signed)
 Andrew Powers is a 63 y.o. male here for  Chief Complaint Chief Complaint  Patient presents with  . Annual Exam    HPI Since first seen in Nov 2024 he has not had followup with me but has been following with physiatry.  He has had nerve conduction study done.  He had nerve conduction study showing chronic severe left carpal tunnel and moderate superimposed polyneuropathy throughout.  Remains on gabapentin 600 mg 3 times a day. He sees a land as well.  At his visit in November of last year we started him on amlodipine 5 mg for hypertension and he remains on it. He had a Cologuard done in November 2024 which was negative.  Blood work last year also reviewed although he had prediabetes with an A1c of 6.0. He has cut down to a ppd of cigarettes. He has cut down etoh to about 4 beers each evenign.  Background-  He has a hx of alcohol (beer about 4-5 day) and tobacco use 2 PPD. 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 Lives alone. No children  Problem List Patient Active Problem List  Diagnosis  . Tobacco abuse  . Alcohol abuse  . Degeneration of intervertebral disc of lumbar region  . Low back pain  . HTN (hypertension), benign    Past Medical History:  Diagnosis Date  . Alcohol abuse 03/14/2023  . Degeneration of intervertebral disc of lumbar region 03/14/2023  . HTN (hypertension), benign 03/05/2024  . Tobacco abuse 03/14/2023    History reviewed. No pertinent surgical history.  Social History Social History   Socioeconomic History  . Marital status: Single  Tobacco Use  . Smoking status: Every Day    Current packs/day: 2.00    Types: Cigarettes  . Smokeless tobacco: Never  Vaping Use  . Vaping status: Never Used  Substance and Sexual Activity  . Alcohol use: Yes    Alcohol/week: 17.0 standard drinks of alcohol    Types: 17 Cans of beer per week    Comment: Daily  . Drug use: Never  . Sexual activity: Defer   Social Drivers of  Health   Financial Resource Strain: Low Risk  (03/05/2024)   Overall Financial Resource Strain (CARDIA)   . Difficulty of Paying Living Expenses: Not hard at all  Food Insecurity: No Food Insecurity (03/05/2024)   Hunger Vital Sign   . Worried About Programme Researcher, Broadcasting/film/video in the Last Year: Never true   . Ran Out of Food in the Last Year: Never true  Transportation Needs: No Transportation Needs (03/05/2024)   PRAPARE - Transportation   . Lack of Transportation (Medical): No   . Lack of Transportation (Non-Medical): No  Housing Stability: Low Risk  (03/05/2024)   Housing Stability Vital Sign   . Unable to Pay for Housing in the Last Year: No   . Number of Times Moved in the Last Year: 0   . Homeless in the Last Year: No    Family History Family History  Problem Relation Name Age of Onset  . Lung disease Mother    . Myocardial Infarction (Heart attack) Father      Medication   Medication List       * Accurate as of March 05, 2024  8:18 AM. If you have any questions, ask your nurse or doctor.          CONTINUE taking these medications    amLODIPine 5 MG tablet Commonly known as:  NORVASC Take 1 tablet by mouth once daily   gabapentin 300 MG capsule Commonly known as: NEURONTIN TAKE 2 CAPSULES BY MOUTH AT BEDTIME FOR 4 DAYS, THEN INCREASE TO 2 CAPSULES TWICE DAILY FOR 4 DAYS, THEN 2 CAPSULES THREE TIMES DAILY.   HYDROcodone-acetaminophen 5-325 mg tablet Commonly known as: NORCO Take one tablet at night for pain; may take up to every 6 hours as needed for pain if not working or driving   multivitamin tablet   predniSONE 10 MG tablet Commonly known as: DELTASONE 6 PO Q D X 1 DAY, THEN 5 PO Q D X 1 DAY, THEN 4 PO Q D X 1 DAY, THEN 3 PO Q D X 1 DAY, THEN 2 PO Q D X 1 DAY, THEN 1 PO Q D X 1 DAY   VITAMIN B-12 ORAL        Allergy No Known Allergies  Review of System A comprehensive 11 system ROS was negative except as per HPI  Physical exam BP 128/74    Pulse 88   Ht 175.3 cm (5' 9)   Wt 68.9 kg (152 lb)   SpO2 94%   BMI 22.45 kg/m   GENERAL:  The patient is alert, oriented and in no acute distress.  HEENT:  PERRLA.  Oropharynx moist without lesions.  Head is normocephalic/atraumatic.  Pupils equal, round and reactive to light and accommodation.  Extraocular movements are intact. Nasal mucosa is normal.  External ear canals normal.  Tympanic membranes are clear.   NECK:  Supple without thyromegaly or lymphadenopathy.  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   CARDIAC:  Regular rate and rhythm, normal S1 and S2 without murmurs, rubs or gallops.   VASCULAR: Carotid and radial pulses 2+.  Distal pulses are 2+.   ABDOMEN:  Soft, with normal bowel sounds.  No organomegaly or tenderness was found.   EXTREMITIES:  Full range of motion with no erythema, heat or effusions.  No cyanosis, clubbing or edema noted.   GENITAL/RECTAL deferred as no complaints NEUROLOGIC:  The patient is alert and oriented.  Cranial nerves II-XII intact.  Motor and sensory examination within normal limits.   Gait normal.  Reflexes symmetric and brisk.   DERMATOLOGIC:  No skin lesions, scaling or bruising noted.    LYMPH:  No cervical or supraclavicular nodes.     Labs No visits with results within 6 Month(s) from this visit.  Latest known visit with results is:  Initial consult on 03/14/2023  Component Date Value Ref Range Status  . Glucose 03/14/2023 98  70 - 110 mg/dL Final  . Sodium 88/94/7975 140  136 - 145 mmol/L Final  . Potassium 03/14/2023 4.2  3.6 - 5.1 mmol/L Final  . Chloride 03/14/2023 101  97 - 109 mmol/L Final  . Carbon Dioxide (CO2) 03/14/2023 30.3  22.0 - 32.0 mmol/L Final  . Urea Nitrogen (BUN) 03/14/2023 14  7 - 25 mg/dL Final  . Creatinine 88/94/7975 0.7  0.7 - 1.3 mg/dL Final  . Glomerular Filtration Rate (eGFR) 03/14/2023 103  >60 mL/min/1.73sq m Final  . Calcium 03/14/2023 9.5  8.7 - 10.3 mg/dL Final   . AST  88/94/7975 21  8 - 39 U/L Final  . ALT  03/14/2023 16  6 - 57 U/L Final  . Alk Phos (alkaline Phosphatase) 03/14/2023 88  34 - 104 U/L Final  . Albumin 03/14/2023 4.6  3.5 - 4.8 g/dL Final  . Bilirubin,  Total 03/14/2023 1.1  0.3 - 1.2 mg/dL Final  . Protein, Total 03/14/2023 7.4  6.1 - 7.9 g/dL Final  . A/G Ratio 88/94/7975 1.6  1.0 - 5.0 gm/dL Final  . Hep C Virus Ab - [facility] 03/14/2023 Non Reactive  Non Reactive Final  . Hemoglobin A1C 03/14/2023 6.0 (H)  4.2 - 5.6 % Final  . Average Blood Glucose (Calc) 03/14/2023 126  mg/dL Final  . Cholesterol, Total 03/14/2023 168  100 - 200 mg/dL Final  . Triglyceride 88/94/7975 63  35 - 199 mg/dL Final  . HDL (High Density Lipoprotein) Cho* 03/14/2023 84.4 (H)  29.0 - 71.0 mg/dL Final  . LDL Calculated 03/14/2023 71  0 - 130 mg/dL Final  . VLDL Cholesterol 03/14/2023 13  mg/dL Final  . Cholesterol/HDL Ratio 03/14/2023 2.0   Final  . Thyroid Stimulating Hormone (TSH) 03/14/2023 1.467  0.450-5.330 uIU/ml uIU/mL Final  . HIV 1/2 Antibodies 03/14/2023 Negative  Negative Final  . HIV 1 P24 Antigen 03/14/2023 Negative  Negative Final  . Color 03/14/2023 Light Yellow  Colorless, Straw, Light Yellow, Yellow, Dark Yellow Final  . Clarity 03/14/2023 Clear  Clear Final  . Specific Gravity 03/14/2023 1.009  1.005 - 1.030 Final  . pH, Urine 03/14/2023 7.0  5.0 - 8.0 Final  . Protein, Urinalysis 03/14/2023 Negative  Negative mg/dL Final  . Glucose, Urinalysis 03/14/2023 Negative  Negative mg/dL Final  . Ketones, Urinalysis 03/14/2023 Negative  Negative mg/dL Final  . Blood, Urinalysis 03/14/2023 Negative  Negative Final  . Nitrite, Urinalysis 03/14/2023 Negative  Negative Final  . Leukocyte Esterase, Urinalysis 03/14/2023 Negative  Negative Final  . Bilirubin, Urinalysis 03/14/2023 Negative  Negative Final  . Urobilinogen, Urinalysis 03/14/2023 0.2  0.2 - 1.0 mg/dL Final  . WBC, UA 88/94/7975 2  <=5 /hpf Final  . Red Blood Cells, Urinalysis  03/14/2023 1  <=3 /hpf Final  . Bacteria, Urinalysis 03/14/2023 0-5  0 - 5 /hpf Final  . Squamous Epithelial Cells, Urinaly* 03/14/2023 0  /hpf Final  . Cologuard 03/20/2023 Negative   Final  . PSA (Prostate Specific Antigen), T* 03/14/2023 0.48  0.10 - 4.00 ng/mL Final    PHQ 2/9 last 3 flowsheet values     03/14/2023    8:29 AM 03/05/2024    8:04 AM  PHQ-2/9 Depression Screening   Little interest or pleasure in doing things 0 0  Feeling down, depressed, or hopeless 0 0  Patient Health Questionnaire-2 Score 0 * 0    * Data saved with a previous flowsheet row definition     Depression Severity and Treatment Recommendations:  0-4= None  5-9= Mild / Treatment: Support, educate to call if worse; return in one month  10-14= Moderate / Treatment: Support, watchful waiting; Antidepressant or Psychotherapy  15-19= Moderately severe / Treatment: Antidepressant OR Psychotherapy  >= 20 = Major depression, severe / Antidepressant AND Psychotherapy   Goals     . Follow my doctor's care plan       MRI  IMPRESSION:  1. Scoliotic curvature convex to the left with the apex at L3-4.  2. L1-2: Disc bulge more prominent towards the right. Facet and  ligamentous hypertrophy. Mild stenosis of the right lateral recess  but no definite neural compression.  3. L2-3: Shallow protrusion of the disc more prominent towards the  right with a focal herniation in the right lateral recess. Facet and  ligamentous hypertrophy. Stenosis of the right lateral recess that  could compress the right L3 nerve.  4. L3-4: Shallow protrusion of the disc more prominent towards the  right. Advanced right facet arthropathy. Stenosis of both lateral  recesses right worse than left. Neural compression likely at this  level on the right.  5. L4-5: Endplate osteophytes and bulging of the disc. Bilateral  facet arthropathy. Multifactorial stenosis of the canal, lateral  recesses and neural foramina that could cause  neural compression on  either or both sides.  6. L5-S1: Endplate osteophytes and shallow protrusion of the disc  more prominent towards the left. Facet arthropathy worse on the  left. Lateral recess and foraminal stenosis left worse than right.  Neural compression could occur on both sides, particularly in the  foramen on the left.  7. Edematous change of the L3, L4, L5 and S1 endplates could  contribute to regional pain. The advanced facet arthropathy would  obviously likely be painful.   Assessment  Andrew Powers is a 63 y.o. male here for  Chief Complaint  Patient presents with  . Annual Exam     Recommendations HCM reviewed with patient Colon - No FH - neg cologuard 2024 PSA .   Lab Results  Component Value Date   TOTALPSA 0.48 03/14/2023   Vaccines declines all vaccines Depression Neg  Elevated BP without dx HTN - Has been consistently elevated.  Doing well on 5 mg  Alcohol abuse- discussed cutting down ETOH intake to < 2 per day, 14 per week. Most recent lfts nml  Tobacco abuse- he is precontemplative. 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

## 2024-03-05 NOTE — Progress Notes (Signed)
 Joe Gee is a 63 y.o. here for Medicare Wellness Visit  MEDICARE WELLNESS VISIT Providers Rendering Care 1. Dr. Alm Needle (PCP) 2. Dr Dodson  Functional Assessment active, lives alone (1) Hearing: Demonstrates no difficulty in hearing during normal conversation (2) Risk of Falls: Patient denies any falls or near falls in the last year, Gait steady without assistance during walk from waiting area to exam room (3) Home Safety: Patient feels secure in their home, There are operational smoke alarms in multiple areas of the home (4) Activities of Daily Living: Independently manages personal grooming and household chores, including cooking, cleaning and laundry. Manages Personal finances without assistance.  Depression Screening PHQ 2/9 last 3 flowsheet values    03/14/2023    8:29 AM 03/05/2024    8:04 AM  PHQ-2/9 Depression Screening   Little interest or pleasure in doing things 0 0  Feeling down, depressed, or hopeless 0 0  Patient Health Questionnaire-2 Score 0 * 0    * Data saved with a previous flowsheet row definition    Depression Severity and Treatment Recommendations:  0-4= None  5-9= Mild / Treatment: Support, educate to call if worse; return in one month  10-14= Moderate / Treatment: Support, watchful waiting; Antidepressant or Psychotherapy  15-19= Moderately severe / Treatment: Antidepressant OR Psychotherapy  >= 20 = Major depression, severe / Antidepressant AND Psychotherapy   Cognitive Impairment Patient denies episodes of loosing things, being forgetful. Seems oriented to person, place and time. Responses appear appropriate and timely to this observer.    * No data to display           PREVENTION PLAN Cardiovascular:  Lab Results  Component Value Date   LDLCALC 71 03/14/2023   Diabetes:  Lab Results  Component Value Date   HGBA1C 6.0 (H) 03/14/2023   Glaucoma: UTD  Smoking Cessation:  Not Applicable  Other Personalized Health  Advice Encouraged patient to exercise 5 days a week, walking, water aerobics, gentle stretching recommended. Increase dietary intake of fresh fruits and vegetables, reduce red meat to twice a week.   Goals     . Follow my doctor's care plan        End of Life Counseling Patient does not have living will in place; Full Code  Current Outpatient Medications  Medication Sig Dispense Refill  . amLODIPine (NORVASC) 5 MG tablet Take 1 tablet (5 mg total) by mouth once daily 90 tablet 3  . gabapentin (NEURONTIN) 300 MG capsule Take 2 capsules (600 mg total) by mouth 3 (three) times daily TAKE 2 CAPSULES BY MOUTH AT BEDTIME FOR 4 DAYS, THEN INCREASE TO 2 CAPSULES TWICE DAILY FOR 4 DAYS, THEN 2 CAPSULES THREE TIMES DAILY. 180 capsule 11   No current facility-administered medications for this visit.    Allergies as of 03/05/2024  . (No Known Allergies)    Patient Active Problem List  Diagnosis  . Tobacco abuse  . Alcohol abuse  . Degeneration of intervertebral disc of lumbar region  . Low back pain  . HTN (hypertension), benign    Past Medical History:  Diagnosis Date  . Alcohol abuse 03/14/2023  . Degeneration of intervertebral disc of lumbar region 03/14/2023  . HTN (hypertension), benign 03/05/2024  . Tobacco abuse 03/14/2023    History reviewed. No pertinent surgical history.  Health Maintenance  Topic Date Due  . Adult Tetanus (Td And Tdap)  Never done  . Pneumococcal Vaccine: 50+ (1 of 2 - PCV) Never done  . Colorectal  Cancer Screening  03/21/2023  . Medicare Intial Physical (IPPE) [phone]  Never done  . AAA Screen  Never done  . Annual Physical/Well Child Check  03/14/2024  . Depression Screening  03/05/2025  . PSA  03/13/2025  . Lipid Panel  03/13/2028  . HIV Screen  Completed  . Hepatitis C Screen  Completed  . Hib Vaccines  Aged Out  . Hepatitis A Vaccines  Aged Out  . Meningococcal B Vaccine  Aged Out  . Meningococcal ACWY Vaccine  Aged Out  . HPV Vaccines   Aged Out  . COVID-19 Vaccine  Discontinued  . Shingrix  Discontinued  . RSV Immunization Pregnant or 60+  Discontinued  . Influenza Vaccine  Discontinued    Vitals:   03/05/24 0805  BP: 128/74  Pulse: 88  SpO2: 94%  Weight: 68.9 kg (152 lb)  Height: 175.3 cm (5' 9)  PainSc: 0-No pain   Body mass index is 22.45 kg/m.   Goals     . Follow my doctor's care plan         Assessment/Plan  1. Medicare wellness visit- Medications and allergies reviewed. Copy of preventative health provided.  Labs reviewed. HCM reviewed with patient Colon - No FH - neg cologuard 2024 PSA .   Lab Results  Component Value Date   TOTALPSA 0.48 03/14/2023   Vaccines declines all vaccines Depression Neg DAVID BELVIE NEEDLE, MD  *Some images could not be shown.

## 2024-03-11 DIAGNOSIS — L720 Epidermal cyst: Secondary | ICD-10-CM | POA: Diagnosis not present

## 2024-03-13 DIAGNOSIS — Z122 Encounter for screening for malignant neoplasm of respiratory organs: Secondary | ICD-10-CM | POA: Diagnosis not present

## 2024-03-13 DIAGNOSIS — F17210 Nicotine dependence, cigarettes, uncomplicated: Secondary | ICD-10-CM | POA: Diagnosis not present

## 2024-03-14 ENCOUNTER — Other Ambulatory Visit: Payer: Self-pay | Admitting: Emergency Medicine

## 2024-03-14 DIAGNOSIS — F17210 Nicotine dependence, cigarettes, uncomplicated: Secondary | ICD-10-CM

## 2024-03-14 DIAGNOSIS — Z122 Encounter for screening for malignant neoplasm of respiratory organs: Secondary | ICD-10-CM

## 2024-03-19 DIAGNOSIS — L720 Epidermal cyst: Secondary | ICD-10-CM | POA: Diagnosis not present

## 2024-03-20 ENCOUNTER — Ambulatory Visit
Admission: RE | Admit: 2024-03-20 | Discharge: 2024-03-20 | Disposition: A | Discharge status: Home / Self Care | Source: Ambulatory Visit | Attending: Emergency Medicine | Admitting: Emergency Medicine

## 2024-03-20 DIAGNOSIS — Z122 Encounter for screening for malignant neoplasm of respiratory organs: Secondary | ICD-10-CM | POA: Diagnosis not present

## 2024-03-20 DIAGNOSIS — F17210 Nicotine dependence, cigarettes, uncomplicated: Secondary | ICD-10-CM | POA: Insufficient documentation

## 2024-04-01 DIAGNOSIS — L720 Epidermal cyst: Secondary | ICD-10-CM | POA: Diagnosis not present
